# Patient Record
Sex: Female | Born: 1995 | Race: Black or African American | Hispanic: No | Marital: Single | Smoking: Never smoker
Health system: Southern US, Community
[De-identification: ages and names within clinical notes are randomized; demographics above are authoritative.]

## PROBLEM LIST (undated history)

## (undated) DIAGNOSIS — Z789 Other specified health status: Secondary | ICD-10-CM

## (undated) HISTORY — DX: Other specified health status: Z78.9

---

## 2017-01-27 ENCOUNTER — Encounter (HOSPITAL_COMMUNITY): Payer: Self-pay

## 2017-01-27 ENCOUNTER — Inpatient Hospital Stay (HOSPITAL_COMMUNITY)
Admission: AD | Admit: 2017-01-27 | Discharge: 2017-01-28 | Disposition: A | Discharge status: Home / Self Care | Payer: Self-pay | Source: Ambulatory Visit | Attending: Obstetrics & Gynecology | Admitting: Obstetrics & Gynecology

## 2017-01-27 DIAGNOSIS — N946 Dysmenorrhea, unspecified: Secondary | ICD-10-CM | POA: Insufficient documentation

## 2017-01-27 DIAGNOSIS — N921 Excessive and frequent menstruation with irregular cycle: Secondary | ICD-10-CM

## 2017-01-27 DIAGNOSIS — N920 Excessive and frequent menstruation with regular cycle: Secondary | ICD-10-CM | POA: Insufficient documentation

## 2017-01-27 DIAGNOSIS — R110 Nausea: Secondary | ICD-10-CM | POA: Insufficient documentation

## 2017-01-27 LAB — CBC
HEMATOCRIT: 36.7 % (ref 36.0–46.0)
HEMOGLOBIN: 12.2 g/dL (ref 12.0–15.0)
MCH: 26.6 pg (ref 26.0–34.0)
MCHC: 33.2 g/dL (ref 30.0–36.0)
MCV: 80 fL (ref 78.0–100.0)
Platelets: 209 10*3/uL (ref 150–400)
RBC: 4.59 MIL/uL (ref 3.87–5.11)
RDW: 13.4 % (ref 11.5–15.5)
WBC: 5.8 10*3/uL (ref 4.0–10.5)

## 2017-01-27 LAB — URINALYSIS, ROUTINE W REFLEX MICROSCOPIC
BILIRUBIN URINE: NEGATIVE
Bacteria, UA: NONE SEEN
Glucose, UA: NEGATIVE mg/dL
Ketones, ur: NEGATIVE mg/dL
Leukocytes, UA: NEGATIVE
NITRITE: NEGATIVE
PH: 6 (ref 5.0–8.0)
Protein, ur: 30 mg/dL — AB
SPECIFIC GRAVITY, URINE: 1.032 — AB (ref 1.005–1.030)

## 2017-01-27 LAB — WET PREP, GENITAL
CLUE CELLS WET PREP: NONE SEEN
SPERM: NONE SEEN
TRICH WET PREP: NONE SEEN
YEAST WET PREP: NONE SEEN

## 2017-01-27 LAB — HCG, QUANTITATIVE, PREGNANCY: HCG, BETA CHAIN, QUANT, S: 1 m[IU]/mL (ref <5)

## 2017-01-27 LAB — POCT PREGNANCY, URINE: Preg Test, Ur: NEGATIVE

## 2017-01-27 MED ORDER — ONDANSETRON 8 MG PO TBDP
8.0000 mg | ORAL_TABLET | Freq: Once | ORAL | Status: AC
  Administered 2017-01-27: 8 mg via ORAL
  Filled 2017-01-27: qty 1

## 2017-01-27 MED ORDER — KETOROLAC TROMETHAMINE 60 MG/2ML IM SOLN
60.0000 mg | Freq: Once | INTRAMUSCULAR | Status: AC
  Administered 2017-01-27: 60 mg via INTRAMUSCULAR
  Filled 2017-01-27: qty 2

## 2017-01-27 NOTE — MAU Note (Signed)
Pt presents complaining of vaginal bleeding and back pain that started yesterday and has gotten heavier. LMP 12/19/16. Was using depo but missed shot in October. States she had 3 positive and one negative UPT at home. Tried tylenol for the pain but it didn't help.

## 2017-01-28 DIAGNOSIS — N946 Dysmenorrhea, unspecified: Secondary | ICD-10-CM

## 2017-01-28 DIAGNOSIS — R110 Nausea: Secondary | ICD-10-CM

## 2017-01-28 DIAGNOSIS — N921 Excessive and frequent menstruation with irregular cycle: Secondary | ICD-10-CM

## 2017-01-28 LAB — GC/CHLAMYDIA PROBE AMP (~~LOC~~) NOT AT ARMC
CHLAMYDIA, DNA PROBE: POSITIVE — AB
NEISSERIA GONORRHEA: NEGATIVE

## 2017-01-28 LAB — ABO/RH: ABO/RH(D): B POS

## 2017-01-28 MED ORDER — PROMETHAZINE HCL 25 MG PO TABS: 25.0000 mg | ORAL_TABLET | Freq: Four times a day (QID) | ORAL | 1 refills | Status: DC | PRN

## 2017-01-28 MED ORDER — IBUPROFEN 600 MG PO TABS: 600.0000 mg | ORAL_TABLET | Freq: Four times a day (QID) | ORAL | 1 refills | Status: DC | PRN

## 2017-01-28 NOTE — MAU Provider Note (Signed)
Chief Complaint: Vaginal Bleeding and Back Pain   First Provider Initiated Contact with Patient 01/27/17 2127     SUBJECTIVE HPI: Wendy Morris is a 21 y.o.  female who presents to [facility] reporting vaginal bleeding, cramping, nausea and pos and neg UPT. Patient's last menstrual period was 12/19/2016. Bled for several weeks. Stopped for ~ 1 week than started again 2 days ago. Late for period. Had been on Depo. Last dose due October, but pt didn't like irreg bleeding on Depo so she stopped. 9-day menstrual periods before Depo.   Location: low back and suprapubic Quality: cramping Severity: 8/10 on pain scale Duration: 1-2 days Course: Worsening Timing: intermittent Modifying factors: No improvement w/ Tylenol Associated signs and symptoms: Pos for VB, nausea. Neg for fever, chills, urinary complaints, vomiting, diarrhea, constipation.  History reviewed. No pertinent past medical history. OB History  No data available   History reviewed. No pertinent surgical history. Social History   Social History  . Marital status: Single    Spouse name: N/A  . Number of children: N/A  . Years of education: N/A   Occupational History  . Not on file.   Social History Main Topics  . Smoking status: Never Smoker  . Smokeless tobacco: Never Used  . Alcohol use No  . Drug use: No  . Sexual activity: Not on file   Other Topics Concern  . Not on file   Social History Narrative  . No narrative on file   History reviewed. No pertinent family history. No current facility-administered medications on file prior to encounter.    No current outpatient prescriptions on file prior to encounter.   No Known Allergies  I have reviewed patient's Past Medical Hx, Surgical Hx, Family Hx, Social Hx, medications and allergies.   Review of Systems  Constitutional: Negative for appetite change, chills, fatigue and fever.  Gastrointestinal: Positive for abdominal pain and nausea. Negative for  abdominal distention, constipation, diarrhea and vomiting.  Genitourinary: Positive for menstrual problem and vaginal bleeding. Negative for dysuria, flank pain, frequency, hematuria and vaginal discharge.  Musculoskeletal: Positive for back pain. Negative for myalgias.  Neurological: Negative for dizziness.    OBJECTIVE Patient Vitals for the past 24 hrs:  BP Temp Temp src Pulse Resp Height Weight  01/27/17 2136 111/78 98.7 F (37.1 C) Oral 65 16 - -  01/27/17 2022 129/78 98.3 F (36.8 C) Oral 71 18 5\' 4"  (1.626 m) 129 lb 12 oz (58.9 kg)   Constitutional: Well-developed, well-nourished female in no acute distress.  Skin: No pallor Cardiovascular: normal rate Respiratory: normal rate and effort.  GI: Abd soft, non-tender, Pos BS x 4 Neurologic: Alert and oriented x 4.  GU: Neg CVAT.  SPECULUM EXAM: Refused  LAB RESULTS Results for orders placed or performed during the hospital encounter of 01/27/17 (from the past 24 hour(s))  Urinalysis, Routine w reflex microscopic     Status: Abnormal   Collection Time: 01/27/17  8:18 PM  Result Value Ref Range   Color, Urine YELLOW YELLOW   APPearance HAZY (A) CLEAR   Specific Gravity, Urine 1.032 (H) 1.005 - 1.030   pH 6.0 5.0 - 8.0   Glucose, UA NEGATIVE NEGATIVE mg/dL   Hgb urine dipstick LARGE (A) NEGATIVE   Bilirubin Urine NEGATIVE NEGATIVE   Ketones, ur NEGATIVE NEGATIVE mg/dL   Protein, ur 30 (A) NEGATIVE mg/dL   Nitrite NEGATIVE NEGATIVE   Leukocytes, UA NEGATIVE NEGATIVE   RBC / HPF TOO NUMEROUS TO COUNT 0 -  5 RBC/hpf   WBC, UA 6-30 0 - 5 WBC/hpf   Bacteria, UA NONE SEEN NONE SEEN   Squamous Epithelial / LPF 0-5 (A) NONE SEEN   Mucous PRESENT   Pregnancy, urine POC     Status: None   Collection Time: 01/27/17  8:36 PM  Result Value Ref Range   Preg Test, Ur NEGATIVE NEGATIVE  hCG, quantitative, pregnancy     Status: None   Collection Time: 01/27/17  9:41 PM  Result Value Ref Range   hCG, Beta Chain, Quant, S 1 <5 mIU/mL   CBC     Status: None   Collection Time: 01/27/17  9:41 PM  Result Value Ref Range   WBC 5.8 4.0 - 10.5 K/uL   RBC 4.59 3.87 - 5.11 MIL/uL   Hemoglobin 12.2 12.0 - 15.0 g/dL   HCT 16.136.7 09.636.0 - 04.546.0 %   MCV 80.0 78.0 - 100.0 fL   MCH 26.6 26.0 - 34.0 pg   MCHC 33.2 30.0 - 36.0 g/dL   RDW 40.913.4 81.111.5 - 91.415.5 %   Platelets 209 150 - 400 K/uL  ABO/Rh     Status: None (Preliminary result)   Collection Time: 01/27/17  9:41 PM  Result Value Ref Range   ABO/RH(D) B POS   Wet prep, genital     Status: Abnormal   Collection Time: 01/27/17 10:46 PM  Result Value Ref Range   Yeast Wet Prep HPF POC NONE SEEN NONE SEEN   Trich, Wet Prep NONE SEEN NONE SEEN   Clue Cells Wet Prep HPF POC NONE SEEN NONE SEEN   WBC, Wet Prep HPF POC FEW (A) NONE SEEN   Sperm NONE SEEN    UPT neg. Quant drawn->Neg.   IMAGING No results found.  MAU COURSE Orders Placed This Encounter  Procedures  . Wet prep, genital  . Urinalysis, Routine w reflex microscopic  . hCG, quantitative, pregnancy  . CBC  . HIV antibody (routine testing) (NOT [facility])  . Lab instructions  . Pregnancy, urine POC  . ABO/Rh  . Discharge patient   Meds ordered this encounter  Medications  . Multiple Vitamins-Minerals (MULTIVITAMIN GUMMIES ADULT PO)    Sig: Take 2 each by mouth every morning.  . ondansetron (ZOFRAN-ODT) disintegrating tablet 8 mg  . ketorolac (TORADOL) injection 60 mg   Pain resolved w/ Toradol.   Pt declines pelvic exam or meds to Tx bleeding.   MDM - Abnormal uterine bleeding w/ Stable VS and Hgb. Offered OCPs, Provera or Depo for Tx. Declines.  - Neg quant hCG.  ASSESSMENT 1. Menorrhagia with irregular cycle   2. Nausea without vomiting   3. Dysmenorrhea     PLAN Discharge home in stable condition. Bleeding precautions Contraceptive choices reviewed. Encouraged to consider Mirena.  List of Gynecologists given.  Follow-up Information    Gynecologist of your choice Follow up.   Why:  for  problems with menstual periods and routine gynecology care       [facility] Follow up.   Why:  as needed in gynecology emergencies Contact information: [address] [id number] [place] [id number] [phone]         Allergies as of 01/28/2017   No Known Allergies     Medication List    TAKE these medications   ibuprofen 600 MG tablet Commonly known as:  ADVIL,MOTRIN Take 1 tablet (600 mg total) by mouth every 6 (six) hours as needed.  MULTIVITAMIN GUMMIES ADULT PO Take 2 each by mouth every morning.   promethazine 25 MG tablet Commonly known as:  PHENERGAN Take 1 tablet (25 mg total) by mouth every 6 (six) hours as needed.        [place], CNM 01/28/2017  12:30 AM

## 2017-01-28 NOTE — Discharge Instructions (Signed)
Abnormal Uterine Bleeding °Abnormal uterine bleeding can affect women at various stages in life, including teenagers, women in their reproductive years, pregnant women, and women who have reached menopause. Several kinds of uterine bleeding are considered abnormal, including: °· Bleeding or spotting between periods. °· Bleeding after sexual intercourse. °· Bleeding that is heavier or more than normal. °· Periods that last longer than usual. °· Bleeding after menopause. ° °Many cases of abnormal uterine bleeding are minor and simple to treat, while others are more serious. Any type of abnormal bleeding should be evaluated by your health care provider. Treatment will depend on the cause of the bleeding. °Follow these instructions at home: °Monitor your condition for any changes. The following actions may help to alleviate any discomfort you are experiencing: °· Avoid the use of tampons and douches as directed by your health care provider. °· Change your pads frequently. ° °You should get regular pelvic exams and Pap tests. Keep all follow-up appointments for diagnostic tests as directed by your health care provider. °Contact a health care provider if: °· Your bleeding lasts more than 1 week. °· You feel dizzy at times. °Get help right away if: °· You pass out. °· You are changing pads every 15 to 30 minutes. °· You have abdominal pain. °· You have a fever. °· You become sweaty or weak. °· You are passing large blood clots from the vagina. °· You start to feel nauseous and vomit. °This information is not intended to replace advice given to you by your health care provider. Make sure you discuss any questions you have with your health care provider. °Document Released: 12/09/2005 Document Revised: 05/22/2016 Document Reviewed: 07/08/2013 °Elsevier Interactive Patient Education © 2017 [employer] ° °Dysmenorrhea °Menstrual cramps (dysmenorrhea) are caused by the muscles of the uterus tightening (contracting) during a  menstrual period. For some women, this discomfort is merely bothersome. For others, dysmenorrhea can be severe enough to interfere with everyday activities for a few days each month. °Primary dysmenorrhea is menstrual cramps that last a couple of days when you start having menstrual periods or soon after. This often begins after a teenager starts having her period. As a woman gets older or has a baby, the cramps will usually lessen or disappear. Secondary dysmenorrhea begins later in life, lasts longer, and the pain may be stronger than primary dysmenorrhea. The pain may start before the period and last a few days after the period. °What are the causes? °Dysmenorrhea is usually caused by an underlying problem, such as: °· The tissue lining the uterus grows outside of the uterus in other areas of the body (endometriosis). °· The endometrial tissue, which normally lines the uterus, is found in or grows into the muscular walls of the uterus (adenomyosis). °· The pelvic blood vessels are engorged with blood just before the menstrual period (pelvic congestive syndrome). °· Overgrowth of cells (polyps) in the lining of the uterus or cervix. °· Falling down of the uterus (prolapse) because of loose or stretched ligaments. °· Depression. °· Bladder problems, infection, or inflammation. °· Problems with the intestine, a tumor, or irritable bowel syndrome. °· Cancer of the female organs or bladder. °· A severely tipped uterus. °· A very tight opening or closed cervix. °· Noncancerous tumors of the uterus (fibroids). °· Pelvic inflammatory disease (PID). °· Pelvic scarring (adhesions) from a previous surgery. °· Ovarian cyst. °· An intrauterine device (IUD) used for birth control. ° °What increases the risk? °You may be at greater risk of   dysmenorrhea if: °· You are younger than age 30. °· You started puberty early. °· You have irregular or heavy bleeding. °· You have never given birth. °· You have a family history of this  problem. °· You are a smoker. ° °What are the signs or symptoms? °· Cramping or throbbing pain in your lower abdomen. °· Headaches. °· Lower back pain. °· Nausea or vomiting. °· Diarrhea. °· Sweating or dizziness. °· Loose stools. °How is this diagnosed? °A diagnosis is based on your history, symptoms, physical exam, diagnostic tests, or procedures. Diagnostic tests or procedures may include: °· Blood tests. °· Ultrasonography. °· An examination of the lining of the uterus (dilation and curettage, D&C). °· An examination inside your abdomen or pelvis with a scope (laparoscopy). °· X-rays. °· CT scan. °· MRI. °· An examination inside the bladder with a scope (cystoscopy). °· An examination inside the intestine or stomach with a scope (colonoscopy, gastroscopy). ° °How is this treated? °Treatment depends on the cause of the dysmenorrhea. Treatment may include: °· Pain medicine prescribed by your health care provider. °· Birth control pills or an IUD with progesterone hormone in it. °· Hormone replacement therapy. °· Nonsteroidal anti-inflammatory drugs (NSAIDs). These may help stop the production of prostaglandins. °· Surgery to remove adhesions, endometriosis, ovarian cyst, or fibroids. °· Removal of the uterus (hysterectomy). °· Progesterone shots to stop the menstrual period. °· Cutting the nerves on the sacrum that go to the female organs (presacral neurectomy). °· Electric current to the sacral nerves (sacral nerve stimulation). °· Antidepressant medicine. °· Psychiatric therapy, counseling, or group therapy. °· Exercise and physical therapy. °· Meditation and yoga therapy. °· Acupuncture. ° °Follow these instructions at home: °· Only take over-the-counter or prescription medicines as directed by your health care provider. °· Place a heating pad or hot water bottle on your lower back or abdomen. Do not sleep with the heating pad. °· Use aerobic exercises, walking, swimming, biking, and other exercises to help  lessen the cramping. °· Massage to the lower back or abdomen may help. °· Stop smoking. °· Avoid alcohol and caffeine. °Contact a health care provider if: °· Your pain does not get better with medicine. °· You have pain with sexual intercourse. °· Your pain increases and is not controlled with medicines. °· You have abnormal vaginal bleeding with your period. °· You develop nausea or vomiting with your period that is not controlled with medicine. °Get help right away if: °You pass out. °This information is not intended to replace advice given to you by your health care provider. Make sure you discuss any questions you have with your health care provider. °Document Released: 12/09/2005 Document Revised: 05/16/2016 Document Reviewed: 05/27/2013 °Elsevier Interactive Patient Education © 2017 [employer] ° °

## 2017-01-29 ENCOUNTER — Telehealth (HOSPITAL_COMMUNITY): Payer: Self-pay

## 2017-01-29 ENCOUNTER — Other Ambulatory Visit: Payer: Self-pay | Admitting: Student

## 2017-01-29 ENCOUNTER — Encounter (HOSPITAL_COMMUNITY): Payer: Self-pay | Admitting: Family Medicine

## 2017-01-29 ENCOUNTER — Ambulatory Visit (HOSPITAL_COMMUNITY)
Admission: EM | Admit: 2017-01-29 | Discharge: 2017-01-29 | Disposition: A | Discharge status: Home / Self Care | Payer: Self-pay | Attending: Family Medicine | Admitting: Family Medicine

## 2017-01-29 DIAGNOSIS — A749 Chlamydial infection, unspecified: Secondary | ICD-10-CM

## 2017-01-29 LAB — HIV ANTIBODY (ROUTINE TESTING W REFLEX): HIV Screen 4th Generation wRfx: NONREACTIVE

## 2017-01-29 MED ORDER — AZITHROMYCIN 250 MG PO TABS
ORAL_TABLET | ORAL | Status: AC
  Filled 2017-01-29: qty 4

## 2017-01-29 MED ORDER — CEFTRIAXONE SODIUM 250 MG IJ SOLR
250.0000 mg | Freq: Once | INTRAMUSCULAR | Status: AC
  Administered 2017-01-29: 250 mg via INTRAMUSCULAR

## 2017-01-29 MED ORDER — LIDOCAINE HCL (PF) 1 % IJ SOLN
INTRAMUSCULAR | Status: AC
  Filled 2017-01-29: qty 2

## 2017-01-29 MED ORDER — AZITHROMYCIN 500 MG PO TABS: 1000.0000 mg | ORAL_TABLET | Freq: Once | ORAL | 0 refills | Status: DC

## 2017-01-29 MED ORDER — CEFTRIAXONE SODIUM 250 MG IJ SOLR
INTRAMUSCULAR | Status: AC
  Filled 2017-01-29: qty 250

## 2017-01-29 MED ORDER — AZITHROMYCIN 250 MG PO TABS
1000.0000 mg | ORAL_TABLET | Freq: Once | ORAL | Status: AC
  Administered 2017-01-29: 1000 mg via ORAL

## 2017-01-29 NOTE — Discharge Instructions (Signed)
You are being treated today for Chlamydia. You have been given an injection of Rocephin and 1 gram of Azithromycin. I recommend you return to clinic in 1 week for retesting to ensure clearing of the infection.

## 2017-01-29 NOTE — ED Provider Notes (Signed)
CSN: [id number]     Arrival date & time 01/29/17  1511 History   None    Chief Complaint  Patient presents with  . Exposure to STD   (Consider location/radiation/quality/duration/timing/severity/associated sxs/prior Treatment) 21 year old female presents to clinic for treatment of Chlamydia. States she received a phone call today from her Women's health provider informing her she was positive and to come to clinic for treatment. She states she has had no symptoms, denies pelvic pain, abdominal pain, nausea, vomiting, fever, chills, or flank pain, and no pain with intercourse.    The history is provided by the patient.  Exposure to STD     History reviewed. No pertinent past medical history. History reviewed. No pertinent surgical history. History reviewed. No pertinent family history. Social History  Substance Use Topics  . Smoking status: Never Smoker  . Smokeless tobacco: Never Used  . Alcohol use No   OB History    No data available     Review of Systems  Reason unable to perform ROS: as covered in HPI.  All other systems reviewed and are negative.   Allergies  Patient has no known allergies.  Home Medications   Prior to Admission medications   Not on File   Meds Ordered and Administered this Visit   Medications  azithromycin (ZITHROMAX) tablet 1,000 mg (not administered)  cefTRIAXone (ROCEPHIN) injection 250 mg (not administered)    BP 111/61   Pulse (!) 59   Temp 98.5 F (36.9 C)   Resp 18   LMP 01/19/2017   SpO2 98%  No data found.   Physical Exam  Constitutional: She is oriented to person, place, and time. She appears well-developed and well-nourished. No distress.  HENT:  Head: Normocephalic and atraumatic.  Cardiovascular: Normal rate and regular rhythm.   Pulmonary/Chest: Effort normal and breath sounds normal.  Abdominal: Soft. Bowel sounds are normal. She exhibits no distension. There is no tenderness. There is no guarding.  Genitourinary:   Genitourinary Comments: Deferred   Neurological: She is alert and oriented to person, place, and time.  Skin: Skin is warm and dry. Capillary refill takes less than 2 seconds. She is not diaphoretic.  Psychiatric: She has a normal mood and affect.  Nursing note and vitals reviewed.   Urgent Care Course     Procedures (including critical care time)  Labs Review Labs Reviewed - No data to display  Imaging Review No results found.   Visual Acuity Review  Right Eye Distance:   Left Eye Distance:   Bilateral Distance:    Right Eye Near:   Left Eye Near:    Bilateral Near:         MDM   1. Chlamydia   You are being treated today for Chlamydia. You have been given an injection of Rocephin and 1 gram of Azithromycin. I recommend you return to clinic in 1 week for retesting to ensure clearing of the infection.      Dorena BodoLawrence Ioanna Colquhoun, NP 01/29/17 [phone]

## 2017-01-29 NOTE — ED Triage Notes (Signed)
Pt here for treatment for chlamydia  

## 2017-02-25 NOTE — Telephone Encounter (Signed)
See "Reason for call" 

## 2017-09-18 ENCOUNTER — Ambulatory Visit (INDEPENDENT_AMBULATORY_CARE_PROVIDER_SITE_OTHER): Payer: Self-pay | Admitting: *Deleted

## 2017-09-18 ENCOUNTER — Encounter: Payer: Self-pay | Admitting: *Deleted

## 2017-09-18 DIAGNOSIS — Z3200 Encounter for pregnancy test, result unknown: Secondary | ICD-10-CM

## 2017-09-18 DIAGNOSIS — Z3201 Encounter for pregnancy test, result positive: Secondary | ICD-10-CM

## 2017-09-18 LAB — POCT PREGNANCY, URINE: PREG TEST UR: POSITIVE — AB

## 2017-09-18 NOTE — Progress Notes (Signed)
Reviewed labs and nurses note. Agree with plan.

## 2017-09-18 NOTE — Progress Notes (Signed)
Here for pregnancy test which was positive. States LMP was 08/15/17 but states had 2 periods in August which is unusual . States had a period 07/23/17 until 07/29/17  And then 08/15/17 until 08/21/17.  States unsure where will get care-is planning  to get medicaid. Informed her I can give her a proof of + pregnancy test, and we looked at her period tracking app and determined her period is usually every 23-26 days. So with LMP this makes her 4wk 6day and EDD 05/22/17. Also reviewed meds with her, not taking any now. Encouraged to take prenatal vitamins and no other meds until checks with ob provider.

## 2017-12-03 ENCOUNTER — Ambulatory Visit (INDEPENDENT_AMBULATORY_CARE_PROVIDER_SITE_OTHER): Payer: Medicaid Other | Admitting: Medical

## 2017-12-03 ENCOUNTER — Encounter: Payer: Self-pay | Admitting: Medical

## 2017-12-03 ENCOUNTER — Other Ambulatory Visit (HOSPITAL_COMMUNITY)
Admission: RE | Admit: 2017-12-03 | Discharge: 2017-12-03 | Disposition: A | Discharge status: Home / Self Care | Payer: Medicaid Other | Source: Ambulatory Visit | Attending: Medical | Admitting: Medical

## 2017-12-03 DIAGNOSIS — Z3402 Encounter for supervision of normal first pregnancy, second trimester: Secondary | ICD-10-CM | POA: Diagnosis not present

## 2017-12-03 DIAGNOSIS — Z3400 Encounter for supervision of normal first pregnancy, unspecified trimester: Secondary | ICD-10-CM | POA: Insufficient documentation

## 2017-12-03 LAB — POCT URINALYSIS DIP (DEVICE)
Bilirubin Urine: NEGATIVE
GLUCOSE, UA: NEGATIVE mg/dL
Hgb urine dipstick: NEGATIVE
Ketones, ur: NEGATIVE mg/dL
LEUKOCYTES UA: NEGATIVE
Nitrite: NEGATIVE
Protein, ur: NEGATIVE mg/dL
SPECIFIC GRAVITY, URINE: 1.015 (ref 1.005–1.030)
UROBILINOGEN UA: 0.2 mg/dL (ref 0.0–1.0)
pH: 6 (ref 5.0–8.0)

## 2017-12-03 NOTE — Patient Instructions (Addendum)
Second Trimester of Pregnancy The second trimester is from week 13 through week 28, month 4 through 6. This is often the time in pregnancy that you feel your best. Often times, morning sickness has lessened or quit. You may have more energy, and you may get hungry more often. Your unborn baby (fetus) is growing rapidly. At the end of the sixth month, he or she is about 9 inches long and weighs about 1 pounds. You will likely feel the baby move (quickening) between 18 and 20 weeks of pregnancy. Follow these instructions at home:  Avoid all smoking, herbs, and alcohol. Avoid drugs not approved by your doctor.  Do not use any tobacco products, including cigarettes, chewing tobacco, and electronic cigarettes. If you need help quitting, ask your doctor. You may get counseling or other support to help you quit.  Only take medicine as told by your doctor. Some medicines are safe and some are not during pregnancy.  Exercise only as told by your doctor. Stop exercising if you start having cramps.  Eat regular, healthy meals.  Wear a good support bra if your breasts are tender.  Do not use hot tubs, steam rooms, or saunas.  Wear your seat belt when driving.  Avoid raw meat, uncooked cheese, and liter boxes and soil used by cats.  Take your prenatal vitamins.  Take 1500-2000 milligrams of calcium daily starting at the 20th week of pregnancy until you deliver your baby.  Try taking medicine that helps you poop (stool softener) as needed, and if your doctor approves. Eat more fiber by eating fresh fruit, vegetables, and whole grains. Drink enough fluids to keep your pee (urine) clear or pale yellow.  Take warm water baths (sitz baths) to soothe pain or discomfort caused by hemorrhoids. Use hemorrhoid cream if your doctor approves.  If you have puffy, bulging veins (varicose veins), wear support hose. Raise (elevate) your feet for 15 minutes, 3-4 times a day. Limit salt in your diet.  Avoid heavy  lifting, wear low heals, and sit up straight.  Rest with your legs raised if you have leg cramps or low back pain.  Visit your dentist if you have not gone during your pregnancy. Use a soft toothbrush to brush your teeth. Be gentle when you floss.  You can have sex (intercourse) unless your doctor tells you not to.  Go to your doctor visits. Get help if:  You feel dizzy.  You have mild cramps or pressure in your lower belly (abdomen).  You have a nagging pain in your belly area.  You continue to feel sick to your stomach (nauseous), throw up (vomit), or have watery poop (diarrhea).  You have bad smelling fluid coming from your vagina.  You have pain with peeing (urination). Get help right away if:  You have a fever.  You are leaking fluid from your vagina.  You have spotting or bleeding from your vagina.  You have severe belly cramping or pain.  You lose or gain weight rapidly.  You have trouble catching your breath and have chest pain.  You notice sudden or extreme puffiness (swelling) of your face, hands, ankles, feet, or legs.  You have not felt the baby move in over an hour.  You have severe headaches that do not go away with medicine.  You have vision changes. This information is not intended to replace advice given to you by your health care provider. Make sure you discuss any questions you have with your health care  provider. Document Released: 03/05/2010 Document Revised: 05/16/2016 Document Reviewed: 02/09/2013 Elsevier Interactive Patient Education  2017 [place] Education Options: [facility] Department Classes:  Childbirth education classes can help you get ready for a positive parenting experience. You can also meet other expectant parents and get free stuff for your baby. Each class runs for five weeks on the same night and costs $45 for the mother-to-be and her support person. Medicaid covers the cost if you are eligible.  Call [phone] to register. [facility] Childbirth Education:  [phone] or [phone] or [email or web address]  Baby & Me Class: Discuss newborn & infant parenting and family adjustment issues with other new mothers in a relaxed environment. Each week brings a new speaker or baby-centered activity. We encourage new mothers to join [place] every Thursday at 11:00am. Babies birth until crawling. No registration or fee. Daddy [employer]: This course offers Dads-to-be the tools and knowledge needed to feel confident on their journey to becoming new fathers. Experienced dads, who have been trained as coaches, teach dads-to-be how to hold, comfort, diaper, swaddle and play with their infant while being able to support the new mom as well. A class for men taught by men. $25/dad Big Brother/Big Sister: Let your children share in the joy of a new brother or sister in this special class designed just for them. Class includes discussion about how families care for babies: swaddling, holding, diapering, safety as well as how they can be helpful in their new role. This class is designed for children ages 67 to 18, but any age is welcome. Please register each child individually. $5/child  Mom Talk: This mom-led group offers support and connection to mothers as they journey through the adjustments and struggles of that sometimes overwhelming first year after the birth of a child. Tuesdays at 10:00am and Thursdays at 6:00pm. Babies welcome. No registration or fee. Breastfeeding Support Group: This group is a mother-to-mother support circle where moms have the opportunity to share their breastfeeding experiences. A Lactation Consultant is present for questions and concerns. Meets each Tuesday at 11:00am. No fee or registration. Breastfeeding Your Baby: Learn what to expect in the first days of breastfeeding your newborn.  This class will help you feel more confident with the skills needed to begin your  breastfeeding experience. Many new mothers are concerned about breastfeeding after leaving the hospital. This class will also address the most common fears and challenges about breastfeeding during the first few weeks, months and beyond. (call for fee) Comfort Techniques and Tour: This 2 hour interactive class will provide you the opportunity to learn & practice hands-on techniques that can help relieve some of the discomfort of labor and encourage your baby to rotate toward the best position for birth. You and your partner will be able to try a variety of labor positions with birth balls and rebozos as well as practice breathing, relaxation, and visualization techniques. A tour of the [facility] is included with this class. $20 per registrant and support person Childbirth Class- Weekend Option: This class is a Weekend version of our Birth & Baby series. It is designed for parents who have a difficult time fitting several weeks of classes into their schedule. It covers the care of your newborn and the basics of labor and childbirth. It also includes a [facility] and lunch. The class is held two consecutive days: beginning on Friday evening from 6:30 - 8:30 p.m. and the  the next day, Saturday from 9 a.m. - 4 p.m. (call for fee) Waterbirth Class: Interested in a waterbirth?  This informational class will help you discover whether waterbirth is the right fit for you. Education about waterbirth itself, supplies you would need and how to assemble your support team is what you can expect from this class. Some obstetrical practices require this class in order to pursue a waterbirth. (Not all obstetrical practices offer waterbirth-check with your healthcare provider.) Register only the expectant mom, but you are encouraged to bring your partner to class! Required if planning waterbirth, no fee. Infant/Child CPR: Parents, grandparents, babysitters, and friends  learn Cardio-Pulmonary Resuscitation skills for infants and children. You will also learn how to treat both conscious and unconscious choking in infants and children. This Family & Friends program does not offer certification. Register each participant individually to ensure that enough mannequins are available. (Call for fee) Grandparent Love: Expecting a grandbaby? This class is for you! Learn about the latest infant care and safety recommendations and ways to support your own child as he or she transitions into the parenting role. Taught by Registered Nurses who are childbirth instructors, but most importantly...they are grandmothers too! $10/person. Childbirth Class- Natural Childbirth: This series of 5 weekly classes is for expectant parents who want to learn and practice natural methods of coping with the process of labor and childbirth. Relaxation, breathing, massage, visualization, role of the partner, and helpful positioning are highlighted. Participants learn how to be confident in their body's ability to give birth. This class will empower and help parents make informed decisions about their own care. Includes discussion that will help new parents transition into the immediate postpartum period. Maternity Care Center Tour of [facility] is included. We suggest taking this class between 25-32 weeks, but it's only a recommendation. $75 per registrant and one support person or $30 Medicaid. Childbirth Class- 3 week Series: This option of 3 weekly classes helps you and your labor partner prepare for childbirth. Newborn care, labor & birth, cesarean birth, pain management, and comfort techniques are discussed and a Maternity Care Center Tour of [facility] is included. The class meets at the same time, on the same day of the week for 3 consecutive weeks beginning with the starting date you choose. $60 for registrant and one support person.  Marvelous Multiples: Expecting twins, triplets, or more?  This class covers the differences in labor, birth, parenting, and breastfeeding issues that face multiples' parents. NICU tour is included. Led by a Certified Childbirth Educator who is the mother of twins. No fee. Caring for Baby: This class is for expectant and adoptive parents who want to learn and practice the most up-to-date newborn care for their babies. Focus is on birth through the first six weeks of life. Topics include feeding, bathing, diapering, crying, umbilical cord care, circumcision care and safe sleep. Parents learn to recognize symptoms of illness and when to call the pediatrician. Register only the mom-to-be and your partner or support person can plan to come with you! $10 per registrant and support person Childbirth Class- online option: This online class offers you the freedom to complete a Birth and Baby series in the comfort of your own home. The flexibility of this option allows you to review sections at your own pace, at times convenient to you and your support people. It includes additional video information, animations, quizzes, and extended activities. Get organized with helpful eClass tools, checklists, and trackers. Once you register online for the   you will receive an email within a few days to accept the invitation and begin the class when the time is right for you. The content will be available to you for 60 days. $60 for 60 days of online access for you and your support people.  Local Doulas: Natural Baby Doulas [email or web address] Tel: [phone] [email or web address] [employer] [phone] [email or web address] The Labor Hassell Halim  (also do waterbirth tub rental) [phone] [email or web address] [facility] Birth Doula [phone] [email or web address].fi Sacred Rhythms  [phone] [email or web address] [employer]  [employer] (PADA) [email or web address] [place] [facility]  [email or web address] Considering Waterbirth? Guide for patients at [facility] [employer]  Why consider waterbirth?  . Gentle birth for babies . Less pain medicine used in labor . May allow for passive descent/less pushing . May reduce perineal tears  . More mobility and instinctive maternal position changes . Increased maternal relaxation . Reduced blood pressure in labor  Is waterbirth safe? What are the risks of infection, drowning or other complications?  . Infection: o Very low risk (3.7 % for tub vs 4.8% for bed) o 7 in 8000 waterbirths with documented infection o Poorly cleaned equipment most common cause o Slightly lower group B strep transmission rate  . Drowning o Maternal:  - Very low risk   - Related to seizures or fainting o Newborn:  - Very low risk. No evidence of increased risk of respiratory problems in multiple large studies - Physiological protection from breathing under water - Avoid underwater birth if there are any fetal complications - Once baby's head is out of the water, keep it out.  . Birth complication o Some reports of cord trauma, but risk decreased by bringing baby to surface gradually o No evidence of increased risk of shoulder dystocia. Mothers can usually change positions faster in water than in a bed, possibly aiding the maneuvers to free the shoulder.   You must attend a Doren Custard class at [facility]  3rd Wednesday of every month from 7-9pm  [employer] by calling [phone] or online at [email or web address]  Bring [place] the certificate from the class to your prenatal appointment  Meet with a midwife at 36 weeks to see if you can still plan a waterbirth and to sign the consent.   Purchase or rent the following supplies:   Water Birth Pool (Birth Pool in a Box or [place] for instance)  (Tubs  start ~$125)  Single-use disposable tub liner designed for your brand of tub  New garden hose labeled "lead-free", "suitable for drinking water",  Electric drain pump to remove water (We recommend 792 gallon per hour or greater pump.)   Separate garden hose to remove the dirty water  Fish net  Bathing suit top (optional)  Long-handled mirror (optional)  Places to purchase or rent supplies  GotWebTools.is for tub purchases and supplies  Waterbirthsolutions.com for tub purchases and supplies  The Labor Ladies (www.[email or web address]) $275 for tub rental/set-up & take down/kit   [employer] (http://www.fleming.com/.htm) Information regarding doulas (labor support) who provide pool rentals  Our practice has a Birth Pool in a Box tub at the hospital that you may borrow on a first-come-first-served basis. It is your responsibility to to set up, clean and break down the tub. We cannot guarantee the availability of this tub in advance. You are responsible for bringing all accessories listed above. If you do not have all necessary supplies you cannot have a waterbirth.    Things that would prevent you from  having a waterbirth:  Premature, <37wks  Previous cesarean birth  Presence of thick meconium-stained fluid  Multiple gestation (Twins, triplets, etc.)  Uncontrolled diabetes or gestational diabetes requiring medication  Hypertension requiring medication or diagnosis of pre-eclampsia  Heavy vaginal bleeding  Non-reassuring fetal heart rate  Active infection (MRSA, etc.). Group B Strep is NOT a contraindication for  waterbirth.  If your labor has to be induced and induction method requires continuous  monitoring of the baby's heart rate  Other risks/issues identified by your obstetrical provider  Please remember that birth is unpredictable. Under certain unforeseeable circumstances your provider may advise against giving birth in the tub. These  decisions will be made on a case-by-case basis and with the safety of you and your baby as our highest priority.    Food Choices for Gastroesophageal Reflux Disease, Adult When you have gastroesophageal reflux disease (GERD), the foods you eat and your eating habits are very important. Choosing the right foods can help ease your discomfort. What guidelines do I need to follow?  Choose fruits, vegetables, whole grains, and low-fat dairy products.  Choose low-fat meat, fish, and poultry.  Limit fats such as oils, salad dressings, butter, nuts, and avocado.  Keep a food diary. This helps you identify foods that cause symptoms.  Avoid foods that cause symptoms. These may be different for everyone.  Eat small meals often instead of 3 large meals a day.  Eat your meals slowly, in a place where you are relaxed.  Limit fried foods.  Cook foods using methods other than frying.  Avoid drinking alcohol.  Avoid drinking large amounts of liquids with your meals.  Avoid bending over or lying down until 2-3 hours after eating. What foods are not recommended? These are some foods and drinks that may make your symptoms worse: Vegetables Tomatoes. Tomato juice. Tomato and spaghetti sauce. Chili peppers. Onion and garlic. Horseradish. Fruits Oranges, grapefruit, and lemon (fruit and juice). Meats High-fat meats, fish, and poultry. This includes hot dogs, ribs, ham, sausage, salami, and bacon. Dairy Whole milk and chocolate milk. Sour cream. Cream. Butter. Ice cream. Cream cheese. Drinks Coffee and tea. Bubbly (carbonated) drinks or energy drinks. Condiments Hot sauce. Barbecue sauce. Sweets/Desserts Chocolate and cocoa. Donuts. Peppermint and spearmint. Fats and Oils High-fat foods. This includes [place] fries and potato chips. Other Vinegar. Strong spices. This includes black pepper, white pepper, red pepper, cayenne, curry powder, cloves, ginger, and chili powder. The items listed  above may not be a complete list of foods and drinks to avoid. Contact your dietitian for more information. This information is not intended to replace advice given to you by your health care provider. Make sure you discuss any questions you have with your health care provider. Document Released: 06/09/2012 Document Revised: 05/16/2016 Document Reviewed: 10/13/2013 Elsevier Interactive Patient Education  2017 [employer].

## 2017-12-03 NOTE — Progress Notes (Signed)
Patient has d/c with odor, thinks it's BV

## 2017-12-03 NOTE — Progress Notes (Signed)
   PRENATAL VISIT NOTE  Subjective:  Wendy Morris is a 21 y.o. G1P0000 at 8045w5d being seen today for her first prenatal care visit.  She is currently monitored for the following issues for this low-risk pregnancy and has Supervision of low-risk first pregnancy on their problem list.  Patient reports heartburn.  Contractions: Not present. Vag. Bleeding: None.  Movement: Absent. Denies leaking of fluid.   The following portions of the patient's history were reviewed and updated as appropriate: allergies, current medications, past family history, past medical history, past social history, past surgical history and problem list. Problem list updated.  Objective:   Vitals:   12/03/17 0815  BP: 121/69  Pulse: 78  Weight: 135 lb (61.2 kg)    Fetal Status: Fetal Heart Rate (bpm): 141   Movement: Absent     General:  Alert, oriented and cooperative. Patient is in no acute distress.  Skin: Skin is warm and dry. No rash noted.   Cardiovascular: Normal heart rate and rhythm noted  Respiratory: Normal respiratory effort, no problems with respiration noted. Clear to ascultation.   Abdomen: Soft, gravid, appropriate for gestational age.  Pain/Pressure: Absent    Normal bowel sounds. Non-tender.   Pelvic: Cervical exam performed    Moderate white discharge, no erythema. Normal cervical contour, no lesions.  Breast: symmetric, no masses, no nipple discharge or bleeding    Extremities: Normal range of motion.  Edema: None  Mental Status:  Normal mood and affect. Normal behavior. Normal judgment and thought content.   Assessment and Plan:  Pregnancy: G1P0000 at 3145w5d  1. Encounter for supervision of low-risk first pregnancy in second trimester - US MFM OB COMP + 14 WK; Future - Obstetric Panel, Including HIV - Hemoglobinopathy Evaluation - Cystic fibrosis gene test - SMN1 Copy Number Analysis - Culture, OB Urine - Cervicovaginal ancillary only - Babyscripts Schedule Optimization  2.  Heartburn - Advised TUMs first and GERD diet discussed and included on AVS  3. Vaginal discharge - Cervicovaginal ancillary only - Patient will be contacted with abnormal results - Encouraged her to complete MyChart registration and download the APP for prompt communication  Our practice model was discussed with patient.   Second trimester warning symptoms and general obstetric precautions including but not limited to vaginal bleeding, contractions, leaking of fluid and fetal movement were reviewed in detail with the patient. Please refer to After Visit Summary for other counseling recommendations.  Return in about 4 weeks (around 12/31/2017) for LOB, Babyscripts.   Vonzella NippleJulie Wenzel, PA-C

## 2017-12-04 LAB — CERVICOVAGINAL ANCILLARY ONLY
BACTERIAL VAGINITIS: POSITIVE — AB
CANDIDA VAGINITIS: NEGATIVE
CHLAMYDIA, DNA PROBE: NEGATIVE
NEISSERIA GONORRHEA: NEGATIVE
TRICH (WINDOWPATH): NEGATIVE

## 2017-12-05 ENCOUNTER — Other Ambulatory Visit: Payer: Self-pay | Admitting: Medical

## 2017-12-05 DIAGNOSIS — N760 Acute vaginitis: Principal | ICD-10-CM

## 2017-12-05 DIAGNOSIS — B9689 Other specified bacterial agents as the cause of diseases classified elsewhere: Secondary | ICD-10-CM

## 2017-12-05 LAB — CULTURE, OB URINE

## 2017-12-05 LAB — URINE CULTURE, OB REFLEX

## 2017-12-05 MED ORDER — METRONIDAZOLE 500 MG PO TABS: 500.0000 mg | ORAL_TABLET | Freq: Two times a day (BID) | ORAL | 0 refills | Status: AC

## 2017-12-05 NOTE — Progress Notes (Signed)
Patient has BV. Treated with Flagyl. Rx sent to pharmacy. Patient notified through MyChart.

## 2017-12-10 DIAGNOSIS — D563 Thalassemia minor: Secondary | ICD-10-CM

## 2017-12-10 DIAGNOSIS — Z3402 Encounter for supervision of normal first pregnancy, second trimester: Secondary | ICD-10-CM

## 2017-12-11 ENCOUNTER — Encounter: Payer: Self-pay | Admitting: Medical

## 2017-12-11 LAB — OBSTETRIC PANEL, INCLUDING HIV
Antibody Screen: NEGATIVE
BASOS ABS: 0 10*3/uL (ref 0.0–0.2)
Basos: 0 %
EOS (ABSOLUTE): 0.1 10*3/uL (ref 0.0–0.4)
EOS: 2 %
HIV Screen 4th Generation wRfx: NONREACTIVE
Hematocrit: 33.2 % — ABNORMAL LOW (ref 34.0–46.6)
Hemoglobin: 11.1 g/dL (ref 11.1–15.9)
Hepatitis B Surface Ag: NEGATIVE
IMMATURE GRANULOCYTES: 0 %
Immature Grans (Abs): 0 10*3/uL (ref 0.0–0.1)
LYMPHS ABS: 1.2 10*3/uL (ref 0.7–3.1)
Lymphs: 18 %
MCH: 26.4 pg — AB (ref 26.6–33.0)
MCHC: 33.4 g/dL (ref 31.5–35.7)
MCV: 79 fL (ref 79–97)
MONOS ABS: 0.6 10*3/uL (ref 0.1–0.9)
Monocytes: 10 %
NEUTROS PCT: 70 %
Neutrophils Absolute: 4.5 10*3/uL (ref 1.4–7.0)
PLATELETS: 235 10*3/uL (ref 150–379)
RBC: 4.2 x10E6/uL (ref 3.77–5.28)
RDW: 14.1 % (ref 12.3–15.4)
RH TYPE: POSITIVE
RPR Ser Ql: NONREACTIVE
Rubella Antibodies, IGG: 2.02 index (ref >0.99)
WBC: 6.5 10*3/uL (ref 3.4–10.8)

## 2017-12-11 LAB — SMN1 COPY NUMBER ANALYSIS (SMA CARRIER SCREENING)

## 2017-12-11 LAB — HEMOGLOBINOPATHY EVALUATION
FERRITIN: 50 ng/mL (ref 15–150)
HGB A2 QUANT: 2 % (ref 1.8–3.2)
HGB F QUANT: 0 % (ref 0.0–2.0)
Hgb A: 98 % (ref 96.4–98.8)
Hgb C: 0 %
Hgb S: 0 %
Hgb Solubility: NEGATIVE
Hgb Variant: 0 %

## 2017-12-11 LAB — CYSTIC FIBROSIS GENE TEST

## 2017-12-11 LAB — ALPHA-THALASSEMIA

## 2017-12-12 ENCOUNTER — Encounter: Payer: Self-pay | Admitting: Medical

## 2017-12-17 ENCOUNTER — Encounter: Payer: Self-pay | Admitting: Student

## 2017-12-17 DIAGNOSIS — D563 Thalassemia minor: Secondary | ICD-10-CM | POA: Insufficient documentation

## 2017-12-22 ENCOUNTER — Encounter (HOSPITAL_COMMUNITY): Payer: Self-pay | Admitting: Medical

## 2017-12-26 ENCOUNTER — Ambulatory Visit (HOSPITAL_COMMUNITY)
Admission: RE | Admit: 2017-12-26 | Discharge: 2017-12-26 | Disposition: A | Discharge status: Home / Self Care | Payer: Medicaid Other | Source: Ambulatory Visit | Attending: Medical | Admitting: Medical

## 2017-12-26 ENCOUNTER — Other Ambulatory Visit: Payer: Self-pay | Admitting: Medical

## 2017-12-26 DIAGNOSIS — Z3402 Encounter for supervision of normal first pregnancy, second trimester: Secondary | ICD-10-CM

## 2017-12-26 DIAGNOSIS — Z363 Encounter for antenatal screening for malformations: Secondary | ICD-10-CM

## 2017-12-26 DIAGNOSIS — Z3A19 19 weeks gestation of pregnancy: Secondary | ICD-10-CM | POA: Insufficient documentation

## 2017-12-30 ENCOUNTER — Ambulatory Visit (INDEPENDENT_AMBULATORY_CARE_PROVIDER_SITE_OTHER): Payer: Medicaid Other | Admitting: Medical

## 2017-12-30 VITALS — BP 118/76 | HR 80 | Wt 138.7 lb

## 2017-12-30 DIAGNOSIS — B3731 Acute candidiasis of vulva and vagina: Secondary | ICD-10-CM

## 2017-12-30 DIAGNOSIS — Z3402 Encounter for supervision of normal first pregnancy, second trimester: Secondary | ICD-10-CM

## 2017-12-30 DIAGNOSIS — B373 Candidiasis of vulva and vagina: Secondary | ICD-10-CM | POA: Diagnosis not present

## 2017-12-30 DIAGNOSIS — D563 Thalassemia minor: Secondary | ICD-10-CM

## 2017-12-30 MED ORDER — TERCONAZOLE 0.8 % VA CREA: 1.0000 | TOPICAL_CREAM | Freq: Every day | VAGINAL | 0 refills | Status: AC

## 2017-12-30 NOTE — Patient Instructions (Addendum)
Second Trimester of Pregnancy The second trimester is from week 13 through week 28, month 4 through 6. This is often the time in pregnancy that you feel your best. Often times, morning sickness has lessened or quit. You may have more energy, and you may get hungry more often. Your unborn baby (fetus) is growing rapidly. At the end of the sixth month, he or she is about 9 inches long and weighs about 1 pounds. You will likely feel the baby move (quickening) between 18 and 20 weeks of pregnancy. Follow these instructions at home:  Avoid all smoking, herbs, and alcohol. Avoid drugs not approved by your doctor.  Do not use any tobacco products, including cigarettes, chewing tobacco, and electronic cigarettes. If you need help quitting, ask your doctor. You may get counseling or other support to help you quit.  Only take medicine as told by your doctor. Some medicines are safe and some are not during pregnancy.  Exercise only as told by your doctor. Stop exercising if you start having cramps.  Eat regular, healthy meals.  Wear a good support bra if your breasts are tender.  Do not use hot tubs, steam rooms, or saunas.  Wear your seat belt when driving.  Avoid raw meat, uncooked cheese, and liter boxes and soil used by cats.  Take your prenatal vitamins.  Take 1500-2000 milligrams of calcium daily starting at the 20th week of pregnancy until you deliver your baby.  Try taking medicine that helps you poop (stool softener) as needed, and if your doctor approves. Eat more fiber by eating fresh fruit, vegetables, and whole grains. Drink enough fluids to keep your pee (urine) clear or pale yellow.  Take warm water baths (sitz baths) to soothe pain or discomfort caused by hemorrhoids. Use hemorrhoid cream if your doctor approves.  If you have puffy, bulging veins (varicose veins), wear support hose. Raise (elevate) your feet for 15 minutes, 3-4 times a day. Limit salt in your diet.  Avoid heavy  lifting, wear low heals, and sit up straight.  Rest with your legs raised if you have leg cramps or low back pain.  Visit your dentist if you have not gone during your pregnancy. Use a soft toothbrush to brush your teeth. Be gentle when you floss.  You can have sex (intercourse) unless your doctor tells you not to.  Go to your doctor visits. Get help if:  You feel dizzy.  You have mild cramps or pressure in your lower belly (abdomen).  You have a nagging pain in your belly area.  You continue to feel sick to your stomach (nauseous), throw up (vomit), or have watery poop (diarrhea).  You have bad smelling fluid coming from your vagina.  You have pain with peeing (urination). Get help right away if:  You have a fever.  You are leaking fluid from your vagina.  You have spotting or bleeding from your vagina.  You have severe belly cramping or pain.  You lose or gain weight rapidly.  You have trouble catching your breath and have chest pain.  You notice sudden or extreme puffiness (swelling) of your face, hands, ankles, feet, or legs.  You have not felt the baby move in over an hour.  You have severe headaches that do not go away with medicine.  You have vision changes. This information is not intended to replace advice given to you by your health care provider. Make sure you discuss any questions you have with your health care   provider. Document Released: 03/05/2010 Document Revised: 05/16/2016 Document Reviewed: 02/09/2013 Elsevier Interactive Patient Education  2017 [employer]   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

## 2017-12-30 NOTE — Progress Notes (Signed)
   PRENATAL VISIT NOTE  Subjective:  Wendy Morris is a 22 y.o. G1P0000 at 6226w4d being seen today for ongoing prenatal care.  She is currently monitored for the following issues for this low-risk pregnancy and has Supervision of low-risk first pregnancy and Alpha thalassemia trait on their problem list.  Patient reports vaginal irritation.  Contractions: Not present. Vag. Bleeding: None.  Movement: Present. Denies leaking of fluid.   The following portions of the patient's history were reviewed and updated as appropriate: allergies, current medications, past family history, past medical history, past social history, past surgical history and problem list. Problem list updated.  Objective:   Vitals:   12/30/17 1328  BP: 118/76  Pulse: 80  Weight: 138 lb 11.2 oz (62.9 kg)    Fetal Status: Fetal Heart Rate (bpm): 142   Movement: Present     General:  Alert, oriented and cooperative. Patient is in no acute distress.  Skin: Skin is warm and dry. No rash noted.   Cardiovascular: Normal heart rate noted  Respiratory: Normal respiratory effort, no problems with respiration noted  Abdomen: Soft, gravid, appropriate for gestational age.  Pain/Pressure: Absent     Pelvic: Cervical exam deferred        Extremities: Normal range of motion.  Edema: None  Mental Status:  Normal mood and affect. Normal behavior. Normal judgment and thought content.   Assessment and Plan:  Pregnancy: G1P0000 at 6826w4d  1. Encounter for supervision of low-risk first pregnancy in second trimester - US MFM OB FOLLOW UP; scheduled to complete anatomy in ~ 1 month  2. Yeast vaginitis, clinical  - terconazole (TERAZOL 3) 0.8 % vaginal cream; Place 1 applicator vaginally at bedtime.  Dispense: 20 g; Refill: 0  Preterm labor/second trimester warning symptoms and general obstetric precautions including but not limited to vaginal bleeding, contractions, leaking of fluid and fetal movement were reviewed in detail with the  patient. Please refer to After Visit Summary for other counseling recommendations.  Return in about 8 weeks (around 02/24/2018) for LOB, 28 week labs (fasting), Babyscripts.   Vonzella NippleJulie Wenzel, PA-C

## 2018-01-27 ENCOUNTER — Ambulatory Visit (HOSPITAL_COMMUNITY)
Admission: RE | Admit: 2018-01-27 | Discharge: 2018-01-27 | Disposition: A | Discharge status: Home / Self Care | Payer: Medicaid Other | Source: Ambulatory Visit | Attending: Medical | Admitting: Medical

## 2018-01-27 ENCOUNTER — Other Ambulatory Visit: Payer: Self-pay | Admitting: Medical

## 2018-01-27 DIAGNOSIS — IMO0002 Reserved for concepts with insufficient information to code with codable children: Secondary | ICD-10-CM

## 2018-01-27 DIAGNOSIS — Z362 Encounter for other antenatal screening follow-up: Secondary | ICD-10-CM | POA: Diagnosis not present

## 2018-01-27 DIAGNOSIS — Z0489 Encounter for examination and observation for other specified reasons: Secondary | ICD-10-CM

## 2018-01-27 DIAGNOSIS — Z3A23 23 weeks gestation of pregnancy: Secondary | ICD-10-CM

## 2018-01-27 DIAGNOSIS — Z3402 Encounter for supervision of normal first pregnancy, second trimester: Secondary | ICD-10-CM

## 2018-01-27 IMAGING — US US MFM OB FOLLOW-UP
1 series · 14 of 28 positions shown · non-contrast
Comparison: none

[Series 1: us mfm ob follow-up · 45 acquisitions, 14 frames shown]
[im 2/45]
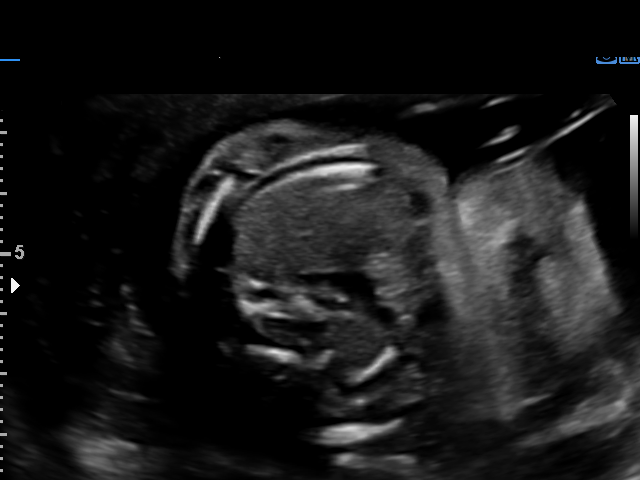
[im 5/45]
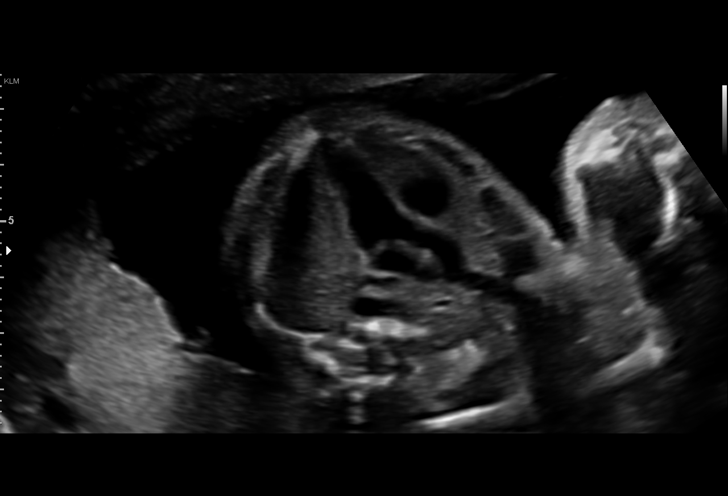
[im 9/45]
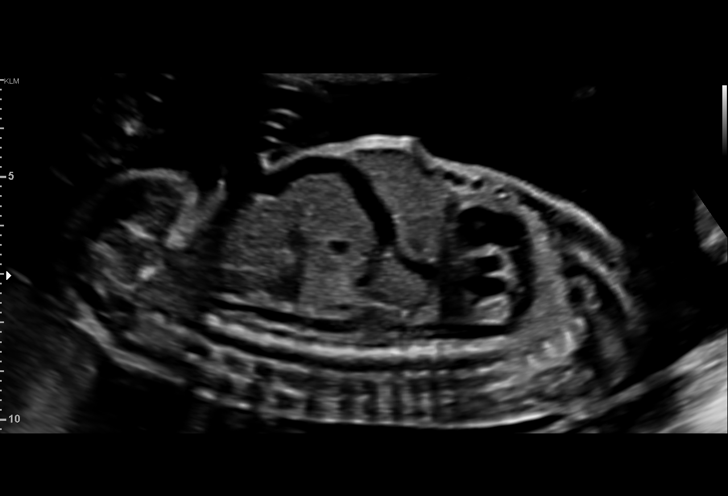
[im 12/45]
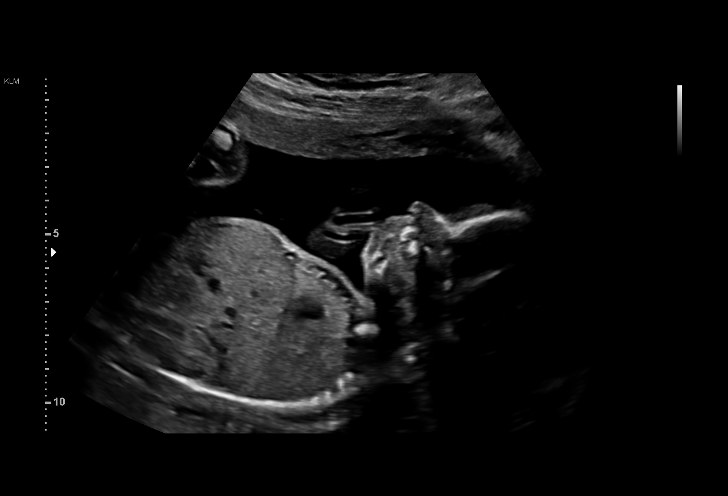
[im 15/45]
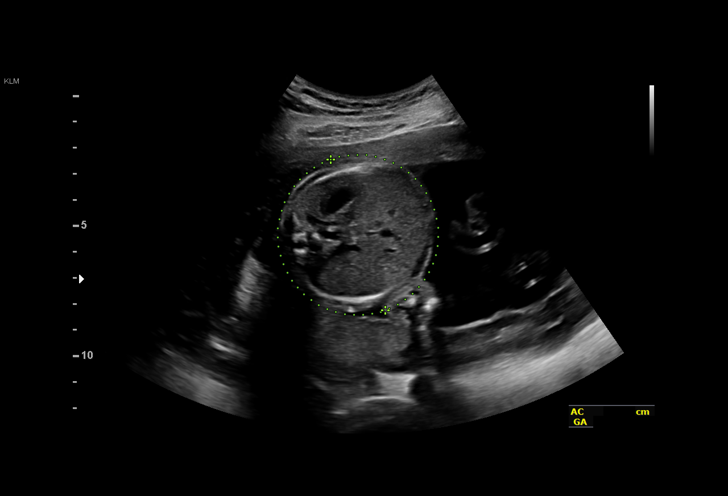
[im 18/45]
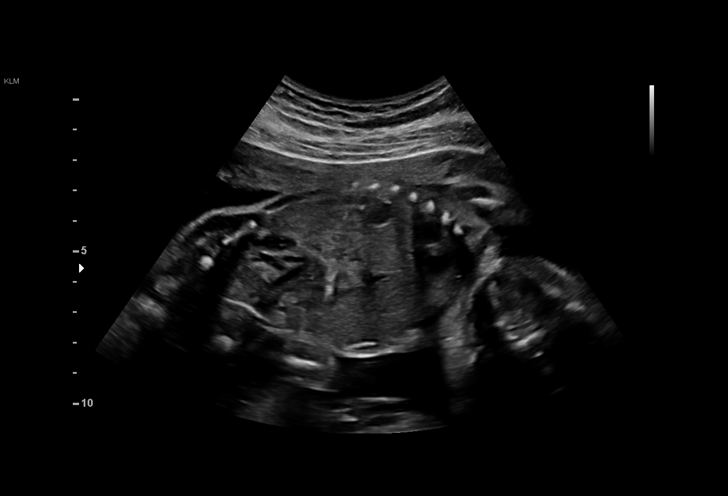
[im 22/45]
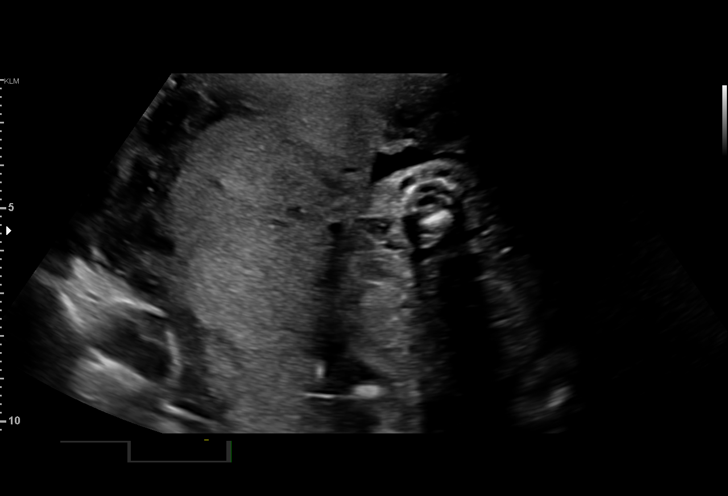
[im 25/45]
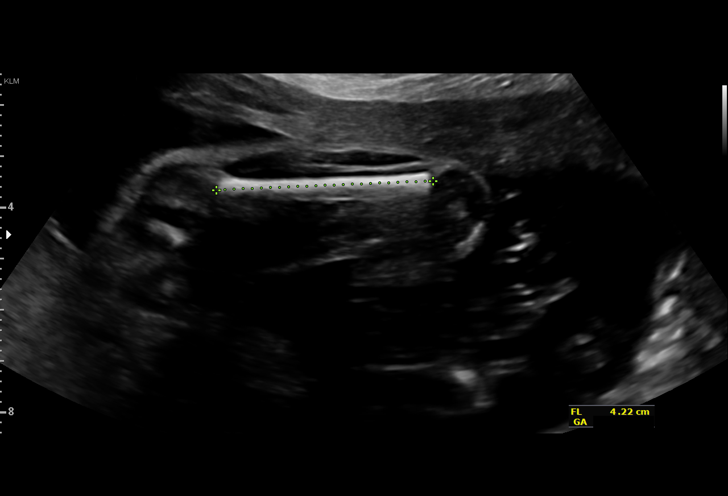
[im 28/45]
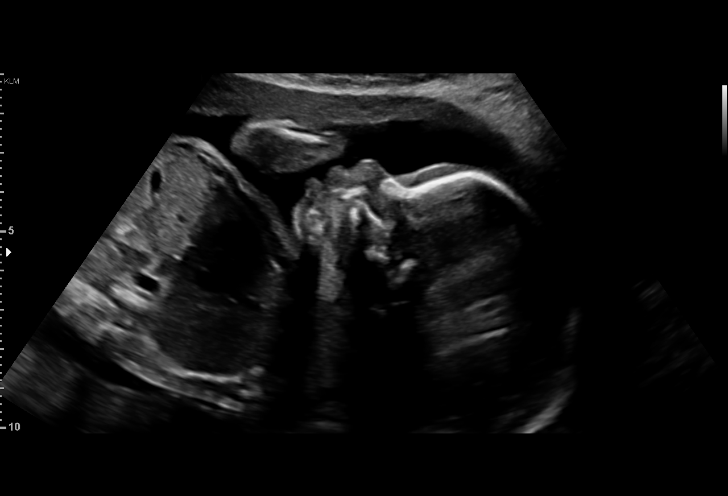
[im 31/45]
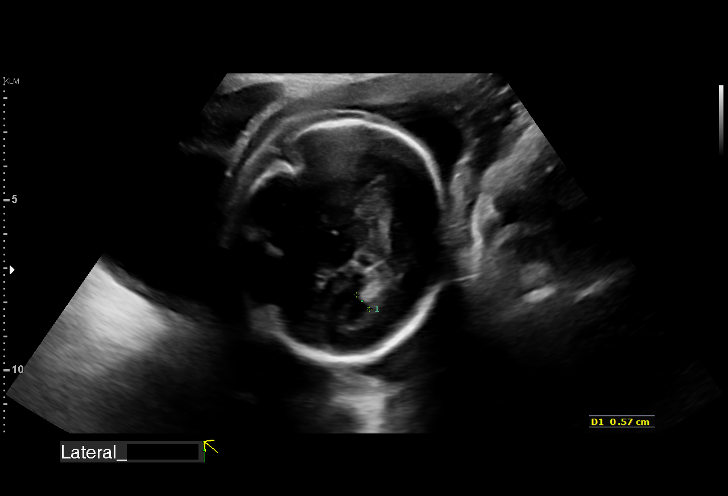
[im 35/45]
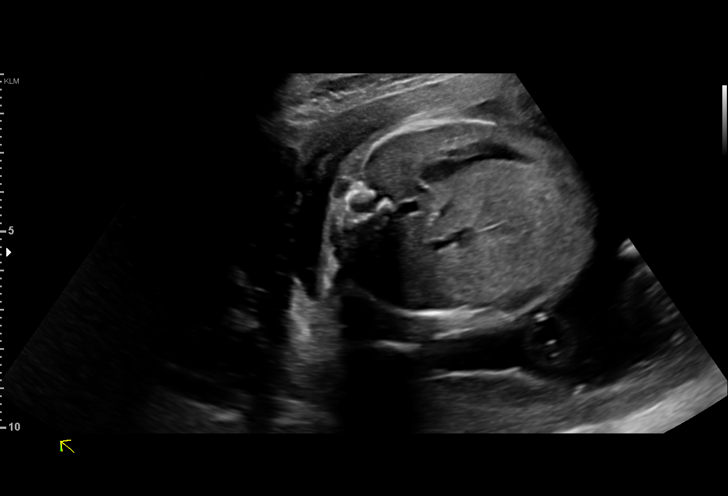
[im 38/45]
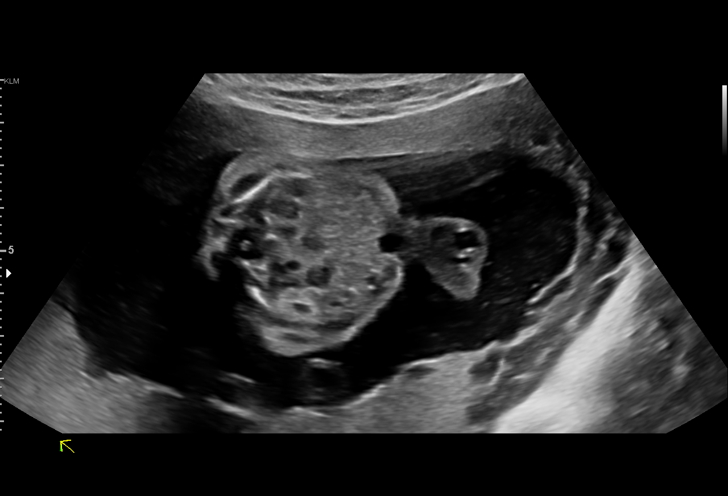
[im 41/45]
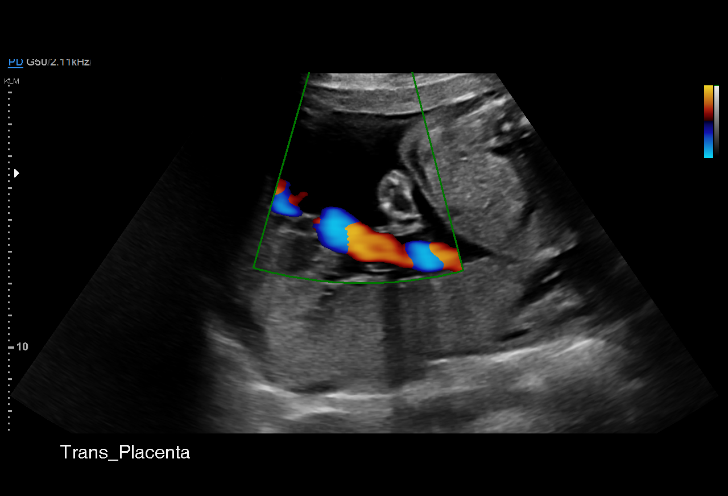
[im 45/45]
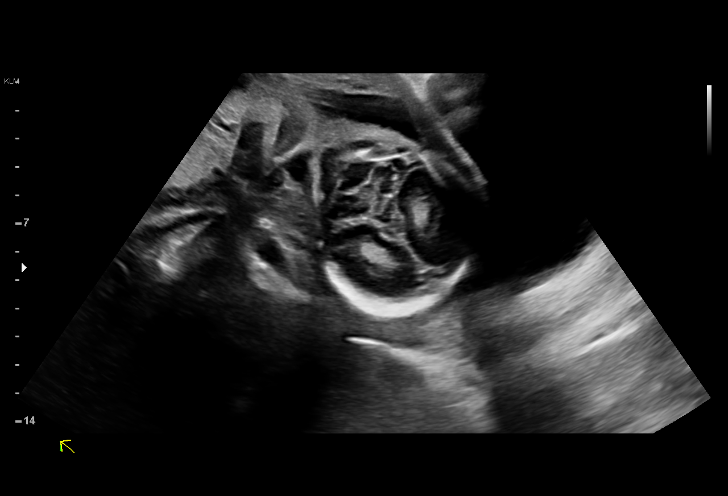

[14 of 28 positions shown; findings below may reference images not displayed]

OB/Gyn Clinic

1  YISEL             [PHONE_NUMBER]      [PHONE_NUMBER]     [PHONE_NUMBER]
Indications

23 weeks gestation of pregnancy
Antenatal follow-up for nonvisualized fetal    [XH]
anatomy
OB History

Blood Type:            Height:  5'4"   Weight (lb):  135       BMI:
Gravidity:    1         Term:   0        Prem:   0        SAB:   0
TOP:          0       Ectopic:  0        Living: 0
Fetal Evaluation

Num Of Fetuses:     1
Fetal Heart         150
Rate(bpm):
Cardiac Activity:   Observed
Presentation:       Cephalic
Placenta:           Posterior, above cervical os
P. Cord Insertion:  Visualized

Amniotic Fluid
AFI FV:      Subjectively within normal limits
Biometry

BPD:      61.2  mm     G. Age:  24w 6d         87  %    CI:        82.39   %    70 - 86
FL/HC:      20.2   %    18.7 -
HC:      212.7  mm     G. Age:  23w 3d         25  %    HC/AC:      1.09        1.05 -
AC:      195.5  mm     G. Age:  24w 2d         62  %    FL/BPD:     70.3   %    71 - 87
FL:         43  mm     G. Age:  24w 1d         54  %    FL/AC:      22.0   %    20 - 24

Est. FW:     663  gm      1 lb 7 oz     62  %
Gestational Age

LMP:           23w 4d        Date:  [DATE]                 EDD:   [DATE]
U/S Today:     24w 1d                                        EDD:   [DATE]
Best:          23w 4d     Det. By:  LMP  ([DATE])          EDD:   [DATE]
Anatomy

Cranium:               Appears normal         Aortic Arch:            Appears normal
Cavum:                 Previously seen        Ductal Arch:            Appears normal
Ventricles:            Appears normal         Diaphragm:              Appears normal
Choroid Plexus:        Previously seen        Stomach:                Appears normal, left
sided
Cerebellum:            Previously seen        Abdomen:                Appears normal
Posterior Fossa:       Previously seen        Abdominal Wall:         Appears nml (cord
insert, abd wall)
Nuchal Fold:           Not applicable (>20    Cord Vessels:           Previously seen
wks GA)
Face:                  Appears normal         Kidneys:                Appear normal
(orbits and profile)
Lips:                  Previously seen        Bladder:                Appears normal
Thoracic:              Appears normal         Spine:                  Previously seen
Heart:                 Appears normal         Upper Extremities:      Previously seen
(4CH, axis, and situs
RVOT:                  Appears normal         Lower Extremities:      Previously seen
LVOT:                  Appears normal

Other:  Nasal bone previously visualized. 5th digit previously visualized.
Technically difficult due to fetal position. Fetus appears to be a female.
Cervix Uterus Adnexa

Cervix
Length:            3.4  cm.
Normal appearance by transabdominal scan.
Impression

Singleton intrauterine pregnancy at 23 weeks 4 days
gestation with fetal cardiac activity
Cephalic presentation
Normal interval anatomy; anatomic survey complete
Normal amniotic fluid volume
Appropriate interval growth with EFW at the 62nd %tile
Posterior placenta
Normal appearing cervical length

Recommendations

Follow-up ultrasounds as clinically indicated.

## 2018-02-22 ENCOUNTER — Encounter: Payer: Self-pay | Admitting: Medical

## 2018-02-25 ENCOUNTER — Encounter: Payer: Medicaid Other | Admitting: Medical

## 2018-03-03 ENCOUNTER — Encounter: Payer: Medicaid Other | Admitting: Obstetrics and Gynecology

## 2018-03-03 ENCOUNTER — Telehealth: Payer: Self-pay | Admitting: Family Medicine

## 2018-03-03 NOTE — Telephone Encounter (Signed)
Patient called to say she had some ruff living arrangements, and had to relocate to [place]. I informed her that once she finds a provider, we could send her records with a signed ROI. Patient voiced her understanding.

## 2018-08-14 ENCOUNTER — Encounter (HOSPITAL_COMMUNITY): Payer: Self-pay
